# Patient Record
Sex: Male | Born: 1995 | Marital: Single | Smoking: Current every day smoker
Health system: Southern US, Community
[De-identification: ages and names within clinical notes are randomized; demographics above are authoritative.]

## PROBLEM LIST (undated history)

## (undated) DIAGNOSIS — Z789 Other specified health status: Secondary | ICD-10-CM

## (undated) HISTORY — PX: FOREARM SURGERY: SHX651

---
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
## 2017-06-29 ENCOUNTER — Other Ambulatory Visit: Payer: Self-pay

## 2017-06-29 ENCOUNTER — Emergency Department (HOSPITAL_COMMUNITY)
Admission: EM | Admit: 2017-06-29 | Discharge: 2017-06-29 | Disposition: A | Discharge status: Home / Self Care | Payer: Medicaid Other | Attending: Emergency Medicine | Admitting: Emergency Medicine

## 2017-06-29 ENCOUNTER — Encounter (HOSPITAL_COMMUNITY): Payer: Self-pay | Admitting: *Deleted

## 2017-06-29 DIAGNOSIS — F17210 Nicotine dependence, cigarettes, uncomplicated: Secondary | ICD-10-CM | POA: Insufficient documentation

## 2017-06-29 DIAGNOSIS — R112 Nausea with vomiting, unspecified: Secondary | ICD-10-CM | POA: Diagnosis not present

## 2017-06-29 DIAGNOSIS — R197 Diarrhea, unspecified: Secondary | ICD-10-CM

## 2017-06-29 LAB — COMPREHENSIVE METABOLIC PANEL
ALT: 69 U/L — ABNORMAL HIGH (ref 17–63)
AST: 64 U/L — ABNORMAL HIGH (ref 15–41)
Albumin: 5.2 g/dL — ABNORMAL HIGH (ref 3.5–5.0)
Alkaline Phosphatase: 96 U/L (ref 38–126)
Anion gap: 10 (ref 5–15)
BUN: 19 mg/dL (ref 6–20)
CO2: 27 mmol/L (ref 22–32)
Calcium: 10 mg/dL (ref 8.9–10.3)
Chloride: 102 mmol/L (ref 101–111)
Creatinine, Ser: 0.73 mg/dL (ref 0.61–1.24)
GFR calc Af Amer: >60 mL/min (ref >60)
GFR calc non Af Amer: >60 mL/min (ref >60)
Glucose, Bld: 100 mg/dL — ABNORMAL HIGH (ref 65–99)
Potassium: 4.6 mmol/L (ref 3.5–5.1)
Sodium: 139 mmol/L (ref 135–145)
Total Bilirubin: 1 mg/dL (ref 0.3–1.2)
Total Protein: 8.6 g/dL — ABNORMAL HIGH (ref 6.5–8.1)

## 2017-06-29 LAB — CBC
HCT: 49 % (ref 39.0–52.0)
Hemoglobin: 16.6 g/dL (ref 13.0–17.0)
MCH: 33.2 pg (ref 26.0–34.0)
MCHC: 33.9 g/dL (ref 30.0–36.0)
MCV: 98 fL (ref 78.0–100.0)
Platelets: 203 10*3/uL (ref 150–400)
RBC: 5 MIL/uL (ref 4.22–5.81)
RDW: 12.7 % (ref 11.5–15.5)
WBC: 13.8 10*3/uL — ABNORMAL HIGH (ref 4.0–10.5)

## 2017-06-29 LAB — LIPASE, BLOOD: Lipase: 27 U/L (ref 11–51)

## 2017-06-29 MED ORDER — ONDANSETRON HCL 4 MG/2ML IJ SOLN
4.0000 mg | Freq: Once | INTRAMUSCULAR | Status: AC
  Administered 2017-06-29: 4 mg via INTRAVENOUS
  Filled 2017-06-29: qty 2

## 2017-06-29 MED ORDER — ONDANSETRON HCL 4 MG PO TABS: 4.0000 mg | ORAL_TABLET | Freq: Four times a day (QID) | ORAL | 0 refills | Status: AC

## 2017-06-29 MED ORDER — LACTATED RINGERS IV BOLUS
1000.0000 mL | Freq: Once | INTRAVENOUS | Status: AC
  Administered 2017-06-29: 1000 mL via INTRAVENOUS

## 2017-06-29 MED ORDER — ONDANSETRON 4 MG PO TBDP
4.0000 mg | ORAL_TABLET | Freq: Once | ORAL | Status: AC | PRN
  Administered 2017-06-29: 4 mg via ORAL
  Filled 2017-06-29: qty 1

## 2017-06-29 NOTE — ED Notes (Signed)
Pt has pressure ulcer noted to left side of buttocks.

## 2017-06-29 NOTE — ED Triage Notes (Signed)
Per EMS, pt complains of n/v/d/abd pain since 8AM today. Pt denies hematemesis or blood in stool.

## 2017-06-29 NOTE — ED Notes (Addendum)
Pt is alert and oriented x 4 and is verbally responsive. Pt is escorted with mother. Pt reports 8 emesis in the past 24 hours, pt reports 1 episode of diarrhea. Pt reports generalized abdominal pain that onset occurs with vomiting 0/10 pain at this time

## 2017-07-04 NOTE — ED Provider Notes (Signed)
[facility]-EMERGENCY DEPT Provider Note   CSN: [id number] Arrival date & time: 06/29/17  0919     History   Chief Complaint Chief Complaint  Patient presents with  . Abdominal Pain  . Emesis  . Diarrhea    HPI Michael Thompson is a 22 y.o. male.  HPI   22 year old male with nausea/vomiting/diarrhea.  Male with any times in the past day and diarrhea x1.  Crampy intermittent abdominal pain.  Is localized.  No fevers.  No blood in stool or emesis.  No sick contacts.  Past Medical History:  Diagnosis Date  . Allergy history unknown     There are no active problems to display for this patient.   Past Surgical History:  Procedure Laterality Date  . FOREARM SURGERY Right    x 2        Home Medications    Prior to Admission medications   Medication Sig Start Date End Date Taking? Authorizing Provider  acetaminophen (TYLENOL) 500 MG tablet Take 1,000 mg by mouth every 6 (six) hours as needed for mild pain, moderate pain or headache.   Yes [provider]  ondansetron (ZOFRAN) 4 MG tablet Take 1 tablet (4 mg total) by mouth every 6 (six) hours. 06/29/17   Raeford Razor, MD    Family History No family history on file.  Social History Social History   Tobacco Use  . Smoking status: Current Every Day Smoker    Packs/day: 1.00    Types: Cigarettes  . Smokeless tobacco: Never Used  Substance Use Topics  . Alcohol use: No  . Drug use: Yes    Types: Marijuana     Allergies   Patient has no known allergies.   Review of Systems Review of Systems  All systems reviewed and negative, other than as noted in HPI.  Physical Exam Updated Vital Signs BP 105/72 (BP Location: Left Arm)   Pulse 94   Temp 98.4 F (36.9 C) (Oral)   Resp 18   Ht 5\' 10"  (1.778 m)   Wt 58.1 kg (128 lb)   SpO2 98%   BMI 18.37 kg/m   Physical Exam  Constitutional: He appears well-developed and well-nourished. No distress.  HENT:  Head:  Normocephalic and atraumatic.  Eyes: Conjunctivae are normal. Right eye exhibits no discharge. Left eye exhibits no discharge.  Neck: Neck supple.  Cardiovascular: Normal rate, regular rhythm and normal heart sounds. Exam reveals no gallop and no friction rub.  No murmur heard. Pulmonary/Chest: Effort normal and breath sounds normal. No respiratory distress.  Abdominal: Soft. He exhibits no distension. There is no tenderness.  Musculoskeletal: He exhibits no edema or tenderness.  Neurological: He is alert.  Skin: Skin is warm and dry.  Psychiatric: He has a normal mood and affect. His behavior is normal. Thought content normal.  Nursing note and vitals reviewed.    ED Treatments / Results  Labs (all labs ordered are listed, but only abnormal results are displayed) Labs Reviewed  COMPREHENSIVE METABOLIC PANEL - Abnormal; Notable for the following components:      Result Value   Glucose, Bld 100 (*)    Total Protein 8.6 (*)    Albumin 5.2 (*)    AST 64 (*)    ALT 69 (*)    All other components within normal limits  CBC - Abnormal; Notable for the following components:   WBC 13.8 (*)    All other components within normal limits  LIPASE, BLOOD  EKG None  Radiology No results found.  Procedures Procedures (including critical care time)  Medications Ordered in ED Medications  ondansetron (ZOFRAN-ODT) disintegrating tablet 4 mg (4 mg Oral Given 06/29/17 0925)  lactated ringers bolus 1,000 mL (0 mLs Intravenous Stopped 06/29/17 1429)  ondansetron (ZOFRAN) injection 4 mg (4 mg Intravenous Given 06/29/17 1246)     Initial Impression / Assessment and Plan / ED Course  I have reviewed the triage vital signs and the nursing notes.  Pertinent labs & imaging results that were available during my care of the patient were reviewed by me and considered in my medical decision making (see chart for details).     likely viral gi illness. labs reviewed. I doubt emergent process.  feeling beter with tx in the ED  Final Clinical Impressions(s) / ED Diagnoses   Final diagnoses:  Nausea vomiting and diarrhea    ED Discharge Orders        Ordered    ondansetron (ZOFRAN) 4 MG tablet  Every 6 hours     06/29/17 1424       Raeford RazorKohut, Shamecka Hocutt, MD 07/04/17 1521

## 2018-05-05 ENCOUNTER — Other Ambulatory Visit: Payer: Self-pay

## 2018-05-05 ENCOUNTER — Ambulatory Visit: Payer: Medicaid Other | Attending: Family Medicine | Admitting: Family Medicine

## 2022-02-21 ENCOUNTER — Other Ambulatory Visit: Payer: Self-pay

## 2022-02-21 ENCOUNTER — Emergency Department (HOSPITAL_COMMUNITY)
Admission: EM | Admit: 2022-02-21 | Discharge: 2022-02-21 | Disposition: A | Discharge status: Home / Self Care | Payer: Medicaid Other | Attending: Emergency Medicine | Admitting: Emergency Medicine

## 2022-02-21 ENCOUNTER — Emergency Department (HOSPITAL_COMMUNITY): Payer: Medicaid Other

## 2022-02-21 ENCOUNTER — Encounter (HOSPITAL_COMMUNITY): Payer: Self-pay

## 2022-02-21 DIAGNOSIS — S01111A Laceration without foreign body of right eyelid and periocular area, initial encounter: Secondary | ICD-10-CM | POA: Insufficient documentation

## 2022-02-21 DIAGNOSIS — S60419A Abrasion of unspecified finger, initial encounter: Secondary | ICD-10-CM

## 2022-02-21 DIAGNOSIS — Z23 Encounter for immunization: Secondary | ICD-10-CM | POA: Insufficient documentation

## 2022-02-21 DIAGNOSIS — S60412A Abrasion of right middle finger, initial encounter: Secondary | ICD-10-CM | POA: Diagnosis not present

## 2022-02-21 MED ORDER — AMOXICILLIN-POT CLAVULANATE 875-125 MG PO TABS
1.0000 | ORAL_TABLET | Freq: Once | ORAL | Status: AC
  Administered 2022-02-21: 1 via ORAL
  Filled 2022-02-21 (×2): qty 1

## 2022-02-21 MED ORDER — TETANUS-DIPHTH-ACELL PERTUSSIS 5-2.5-18.5 LF-MCG/0.5 IM SUSY
0.5000 mL | PREFILLED_SYRINGE | Freq: Once | INTRAMUSCULAR | Status: AC
  Administered 2022-02-21: 0.5 mL via INTRAMUSCULAR
  Filled 2022-02-21: qty 0.5

## 2022-02-21 MED ORDER — AMOXICILLIN-POT CLAVULANATE 875-125 MG PO TABS: 1.0000 | ORAL_TABLET | Freq: Once | ORAL | Status: DC

## 2022-02-21 MED ORDER — AMOXICILLIN-POT CLAVULANATE 875-125 MG PO TABS: 1.0000 | ORAL_TABLET | Freq: Two times a day (BID) | ORAL | 0 refills | Status: AC
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
## 2022-02-21 NOTE — Discharge Instructions (Addendum)
Laceration to the lower lid of your eye.  Go to the eye doctor's office.  Dr. Katy Fitch meet you there.  Call his cell phone upon discharge to let him know you are on your way.  The address is [place].  His phone number is [phone]  The antibiotics to prevent infection in your hand, follow-up close with your primary care doctor, come back to the ER for new or worsening symptoms.
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
